# Patient Record
Sex: Male | Born: 1950 | Race: White | Hispanic: Yes | Marital: Married | Smoking: Never smoker
Health system: Southern US, Community
[De-identification: ages and names within clinical notes are randomized; demographics above are authoritative.]

## PROBLEM LIST (undated history)

## (undated) DIAGNOSIS — I10 Essential (primary) hypertension: Secondary | ICD-10-CM

## (undated) HISTORY — PX: CHEST SURGERY: SHX595

## (undated) HISTORY — PX: HERNIA REPAIR: SHX51

---

## 2009-01-20 ENCOUNTER — Emergency Department (HOSPITAL_COMMUNITY): Admission: EM | Admit: 2009-01-20 | Discharge: 2009-01-20 | Payer: Self-pay | Admitting: Emergency Medicine

## 2009-02-15 ENCOUNTER — Encounter: Admission: RE | Admit: 2009-02-15 | Discharge: 2009-02-15 | Payer: Self-pay | Admitting: Surgery

## 2009-02-17 ENCOUNTER — Ambulatory Visit (HOSPITAL_BASED_OUTPATIENT_CLINIC_OR_DEPARTMENT_OTHER): Admission: RE | Admit: 2009-02-17 | Discharge: 2009-02-17 | Payer: Self-pay | Admitting: Surgery

## 2010-09-25 IMAGING — CR DG LUMBAR SPINE COMPLETE 4+V
5 series · 5 of 5 positions shown · non-contrast
Comparison: None

CLINICAL DATA: Low back pain.

LUMBAR SPINE - COMPLETE 4+ VIEW

[t l-spine a.p.]
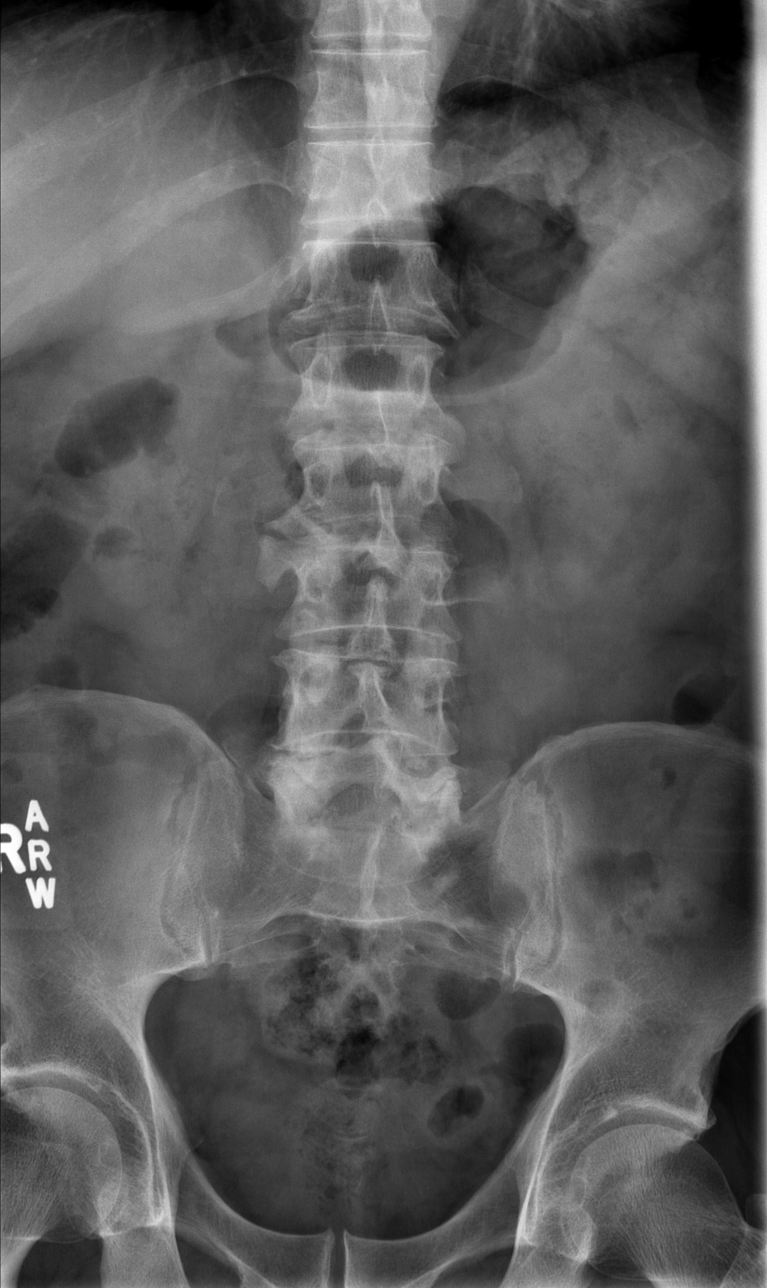

[t l-spine oblique exposure (1 of 2)]
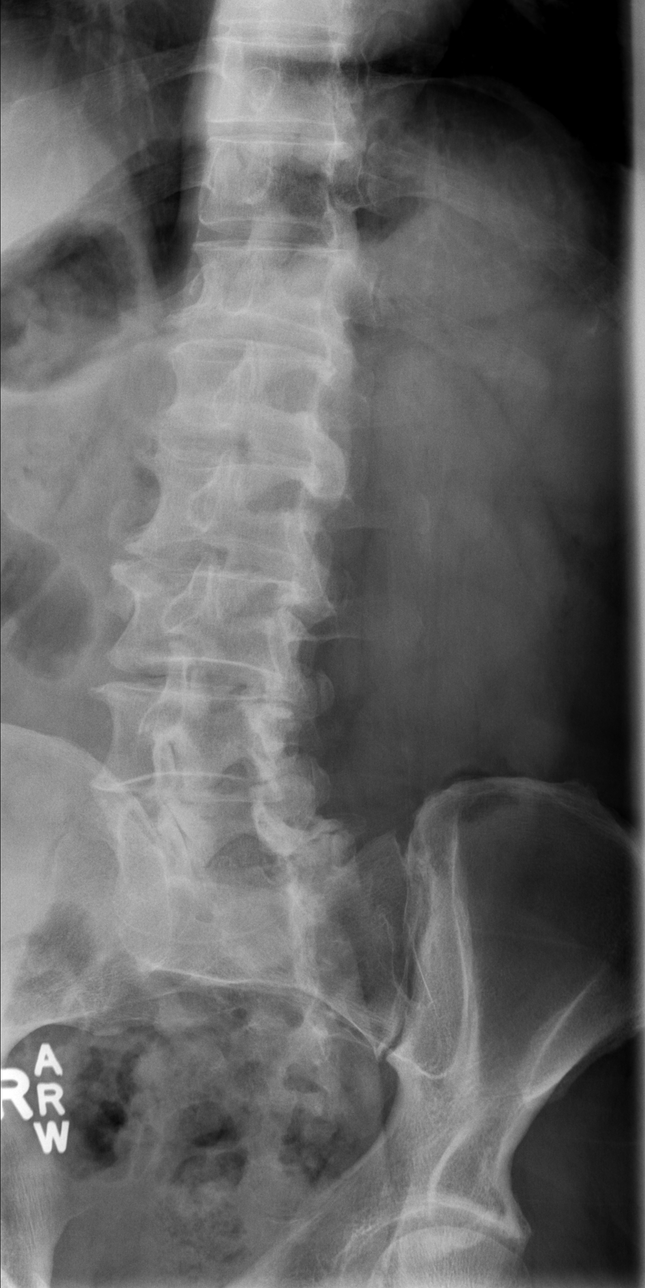

[t l-spine oblique exposure (2 of 2)]
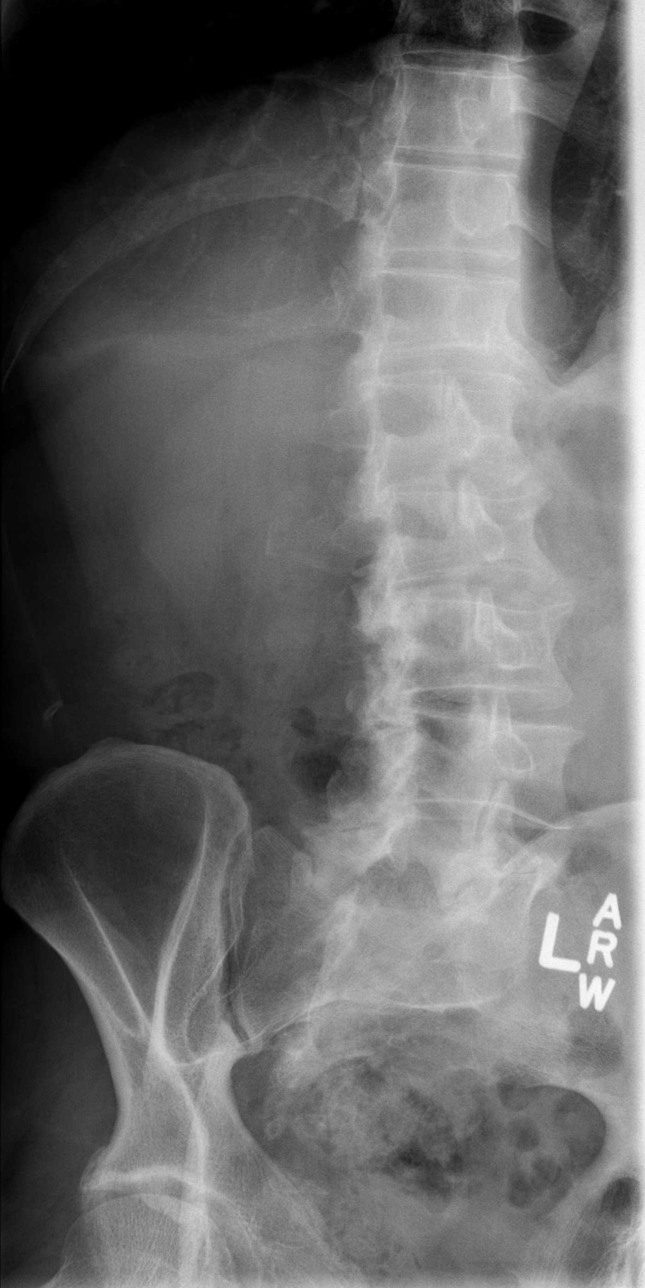

[t l-spine lat]
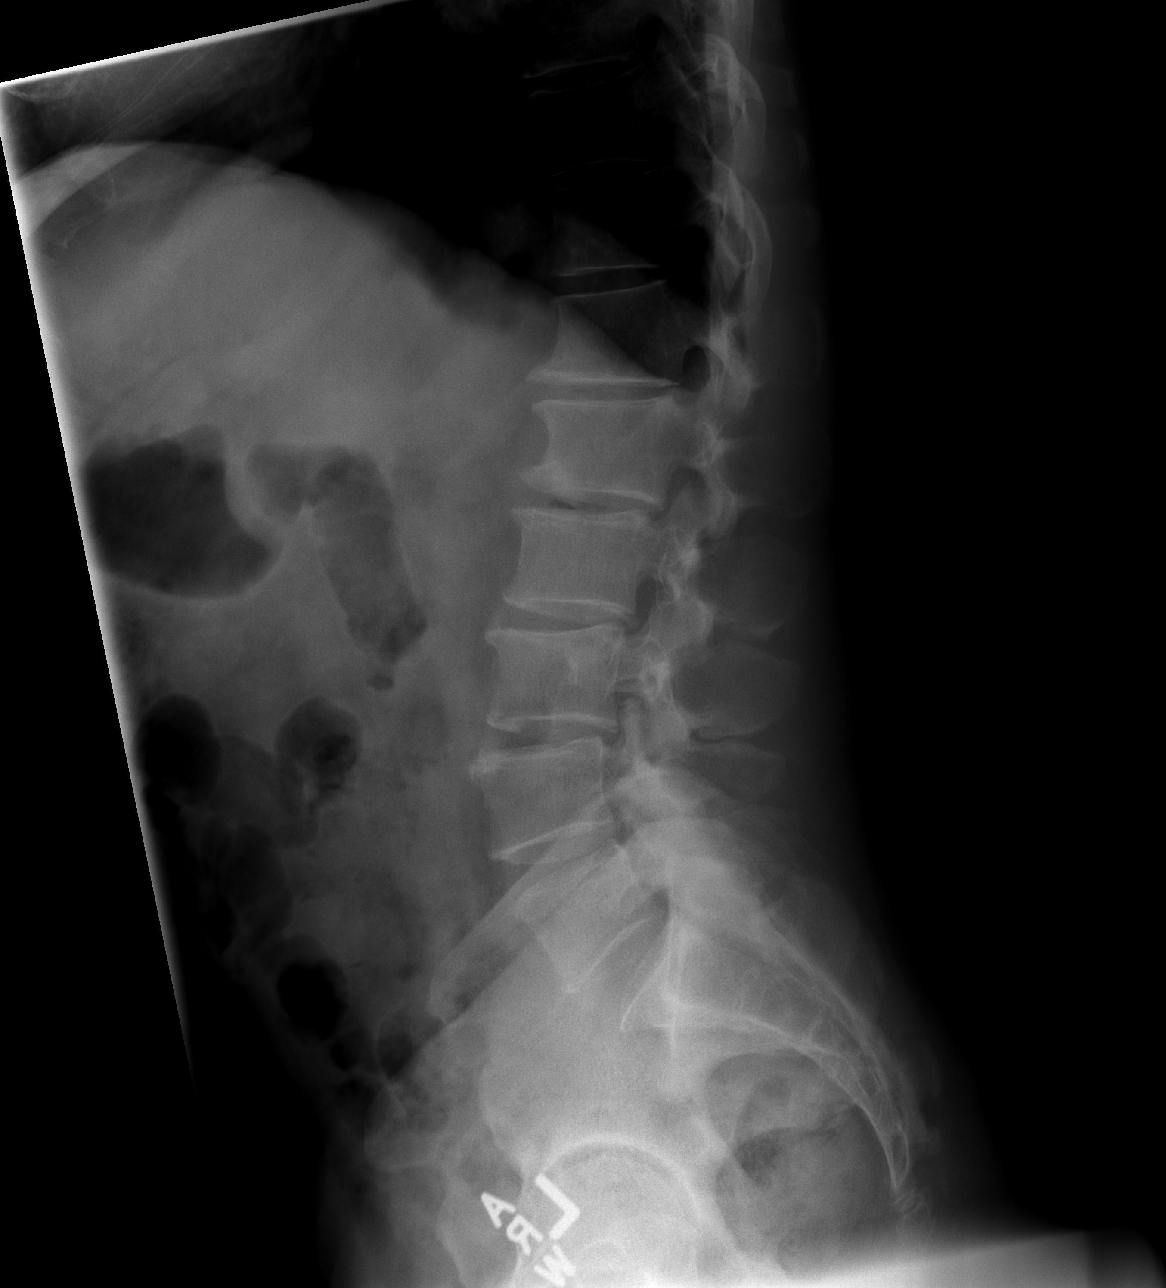

[t l-spine l5-s1 spot]
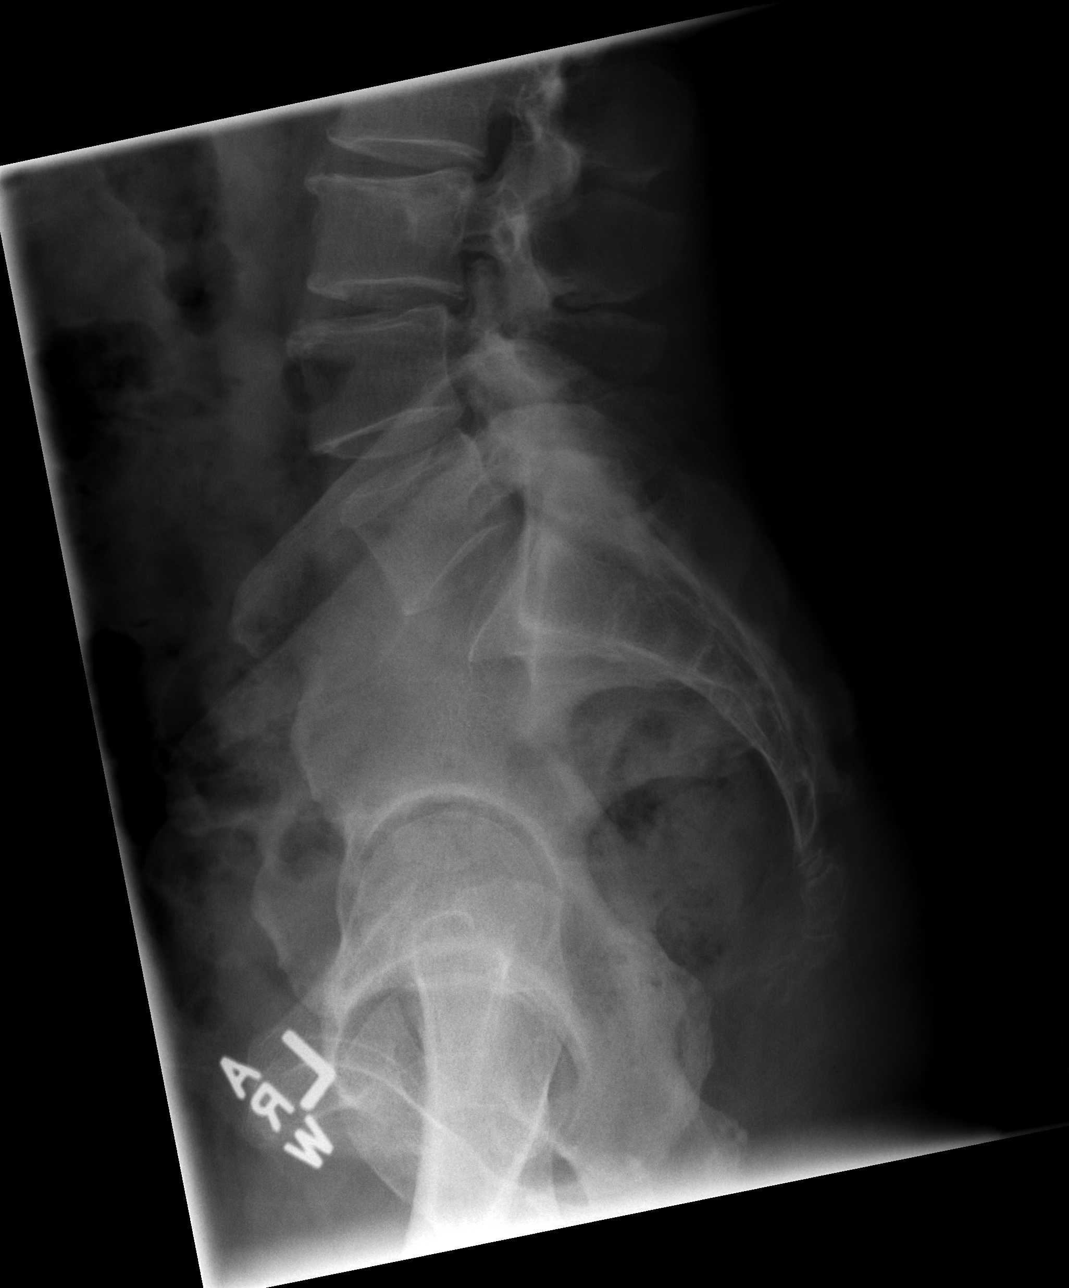

[5 of 5 positions shown; findings below may reference images not displayed]

FINDINGS: The lateral film demonstrates mild degenerative lumbar
spondylosis with mild anterolisthesis of L4 compared L3.  There are
degenerative facet changes in the lower lumbar spine but no
definite pars defects are seen.  Multilevel degenerative disc
disease.  No acute bony findings.  The visualized bony pelvis is
intact.
IMPRESSION: Mild to moderate degenerative changes but no acute bony findings.

## 2010-09-29 ENCOUNTER — Encounter: Admission: RE | Admit: 2010-09-29 | Discharge: 2010-09-29 | Payer: Self-pay | Admitting: General Surgery

## 2011-03-02 LAB — DIFFERENTIAL
Basophils Absolute: 0 10*3/uL (ref 0.0–0.1)
Basophils Relative: 0 % (ref 0–1)
Eosinophils Absolute: 0.1 10*3/uL (ref 0.0–0.7)
Eosinophils Relative: 2 % (ref 0–5)

## 2011-03-02 LAB — BASIC METABOLIC PANEL
BUN: 15 mg/dL (ref 6–23)
CO2: 29 mEq/L (ref 19–32)
Chloride: 105 mEq/L (ref 96–112)
GFR calc non Af Amer: >60 mL/min (ref >60)
Glucose, Bld: 91 mg/dL (ref 70–99)
Potassium: 3.9 mEq/L (ref 3.5–5.1)

## 2011-03-02 LAB — URINALYSIS, ROUTINE W REFLEX MICROSCOPIC
Bilirubin Urine: NEGATIVE
Hgb urine dipstick: NEGATIVE
Ketones, ur: NEGATIVE mg/dL
Nitrite: NEGATIVE
Urobilinogen, UA: 0.2 mg/dL (ref 0.0–1.0)

## 2011-03-02 LAB — CBC
HCT: 42.2 % (ref 39.0–52.0)
MCHC: 34.4 g/dL (ref 30.0–36.0)
MCV: 93.4 fL (ref 78.0–100.0)
Platelets: 202 10*3/uL (ref 150–400)
RDW: 12.4 % (ref 11.5–15.5)

## 2011-03-02 LAB — POCT HEMOGLOBIN-HEMACUE: Hemoglobin: 15.2 g/dL (ref 13.0–17.0)

## 2011-04-04 NOTE — Op Note (Signed)
Barron, [name]   ACCOUNT NO.:  [id number]   MEDICAL RECORD NO.:  [id number]          PATIENT TYPE:  AMB   LOCATION:  DSC                          FACILITY:  [facility]   PHYSICIAN:  Thomas A. Cornett, M.D.DATE OF BIRTH:  11-Apr-1951   DATE OF PROCEDURE:  02/17/2009  DATE OF DISCHARGE:                               OPERATIVE REPORT   PREOPERATIVE DIAGNOSIS:  Right inguinal hernia.   POSTOPERATIVE DIAGNOSIS:  Right inguinal hernia.   PROCEDURE:  Repair of right inguinal hernia with mesh.   SURGEON:  Maisie Fus A. Cornett, MD   ANESTHESIA:  LMA with 0.25% Sensorcaine with epinephrine local.   ESTIMATED BLOOD LOSS:  10 mL   SPECIMENS:  None.   DRAINS:  None.   INDICATIONS FOR PROCEDURE:  The patient is a 60 year old Hispanic male  who presents with a right inguinal hernia.  This is causing discomfort  and he wished to have it repaired.  Discussion was made via a translator  about the procedure, potential complications, and expected outcomes.  He  understood and agreed to proceed.   DESCRIPTION OF PROCEDURE:  The patient was brought to the operating room  and placed supine.  After induction of LMA anesthesia, the right  inguinal region was prepped and draped in a sterile fashion.  He  received preoperative antibiotics.  After infiltration of the right  inguinal crease with 0.25% Sensorcaine local, an oblique incision was  made.  Dissection was carried down through the Scarpa fascia and the  superficial epigastric vessels were ligated with 3-0 Vicryl ties.  The  external oblique aponeurosis was visualized and injected with 0.25%  Sensorcaine.  The fibers were opened in their direction to open the roof  of the inguinal canal.  The cord structures were identified and  encircled with a 0.25-inch Penrose drain.  He had a moderate-sized  direct inguinal hernia.  I was able to open the floor of the inguinal  canal to expose the preperitoneal space.  I put my finger around to  create space.  Examination of the cord revealed no evidence of indirect  hernia.  The iliohypogastric nerves were identified and were medial to  dissection field.  The ilioinguinal nerve was running directly with the  cord and I left it intact.  Some small branches off the ilioinguinal  nerve were divided, so that it would not be tethered to the mesh.  The  femoral nerves were running below the cord structures.  The inner  leaflet of the Prolene hernia system mesh was placed in the  preperitoneal space and spread out.  The onlay was placed on the floor  of the inguinal canal.  A small slit was cut from the cord structures.  I secured the connector piece of mesh to the internal oblique with a 0  Vicryl and a 0 Vicryl was used to connect the onlay to the pubic  tubercle.  A 0 Novofil was used to secure the mesh to the shelving edges  of the inguinal ligament into the conjoined tendon medially.  The new  internal ring had enough space to fit the tip of my fifth digit.  The  mesh laid  quite nicely with no tension.  Irrigation was used and  hemostasis was obtained.  The aponeurosis was closed with a running 2-0  Vicryl.  The 3-0 Vicryl was used to close the Scarpa fascia and 4-0  Monocryl was used to close the skin in a subcuticular fashion.  All  final counts of sponge, needle, and instruments were found to be correct  at this portion of the case.  The patient was awoke and taken to the  recovery in satisfactory condition.      Thomas A. Cornett, M.D.  Electronically Signed     TAC/MEDQ  D:  02/17/2009  T:  02/18/2009  Job:  [id number]

## 2015-05-20 ENCOUNTER — Encounter (HOSPITAL_COMMUNITY): Payer: Self-pay | Admitting: Cardiology

## 2015-05-20 ENCOUNTER — Emergency Department (HOSPITAL_COMMUNITY): Payer: No Typology Code available for payment source

## 2015-05-20 ENCOUNTER — Encounter (HOSPITAL_COMMUNITY): Payer: Self-pay | Admitting: *Deleted

## 2015-05-20 ENCOUNTER — Emergency Department (HOSPITAL_COMMUNITY)
Admission: EM | Admit: 2015-05-20 | Discharge: 2015-05-20 | Disposition: A | Discharge status: Home / Self Care | Payer: No Typology Code available for payment source | Attending: Emergency Medicine | Admitting: Emergency Medicine

## 2015-05-20 ENCOUNTER — Emergency Department (HOSPITAL_COMMUNITY)
Admission: EM | Admit: 2015-05-20 | Discharge: 2015-05-20 | Disposition: A | Discharge status: Other Acute Inpatient Hospital | Payer: No Typology Code available for payment source | Source: Home / Self Care | Attending: Family Medicine | Admitting: Family Medicine

## 2015-05-20 DIAGNOSIS — R1013 Epigastric pain: Secondary | ICD-10-CM | POA: Insufficient documentation

## 2015-05-20 DIAGNOSIS — B079 Viral wart, unspecified: Secondary | ICD-10-CM

## 2015-05-20 DIAGNOSIS — R0789 Other chest pain: Secondary | ICD-10-CM

## 2015-05-20 DIAGNOSIS — I10 Essential (primary) hypertension: Secondary | ICD-10-CM | POA: Insufficient documentation

## 2015-05-20 DIAGNOSIS — R072 Precordial pain: Secondary | ICD-10-CM

## 2015-05-20 DIAGNOSIS — R079 Chest pain, unspecified: Secondary | ICD-10-CM | POA: Insufficient documentation

## 2015-05-20 HISTORY — DX: Essential (primary) hypertension: I10

## 2015-05-20 LAB — BASIC METABOLIC PANEL
Anion gap: 6 (ref 5–15)
BUN: 21 mg/dL — ABNORMAL HIGH (ref 6–20)
CALCIUM: 9 mg/dL (ref 8.9–10.3)
CHLORIDE: 106 mmol/L (ref 101–111)
CO2: 27 mmol/L (ref 22–32)
Creatinine, Ser: 0.83 mg/dL (ref 0.61–1.24)
GFR calc Af Amer: >60 mL/min (ref >60)
GLUCOSE: 88 mg/dL (ref 65–99)
POTASSIUM: 3.9 mmol/L (ref 3.5–5.1)
SODIUM: 139 mmol/L (ref 135–145)

## 2015-05-20 LAB — CBC
HEMATOCRIT: 43.4 % (ref 39.0–52.0)
HEMOGLOBIN: 15.1 g/dL (ref 13.0–17.0)
MCH: 32.5 pg (ref 26.0–34.0)
MCHC: 34.8 g/dL (ref 30.0–36.0)
MCV: 93.5 fL (ref 78.0–100.0)
Platelets: 201 10*3/uL (ref 150–400)
RBC: 4.64 MIL/uL (ref 4.22–5.81)
RDW: 12.6 % (ref 11.5–15.5)
WBC: 8.4 10*3/uL (ref 4.0–10.5)

## 2015-05-20 LAB — I-STAT TROPONIN, ED: TROPONIN I, POC: 0 ng/mL (ref 0.00–0.08)

## 2015-05-20 MED ORDER — IMIQUIMOD 5 % EX CREA: TOPICAL_CREAM | CUTANEOUS | Status: AC

## 2015-05-20 MED ORDER — RANITIDINE HCL 150 MG PO CAPS: 150.0000 mg | ORAL_CAPSULE | Freq: Every day | ORAL | Status: DC

## 2015-05-20 NOTE — ED Provider Notes (Signed)
CSN: [id number]     Arrival date & time 05/20/15  1452 History   First MD Initiated Contact with Patient 05/20/15 1647     Chief Complaint  Patient presents with  . Chest Pain     (Consider location/radiation/quality/duration/timing/severity/associated sxs/prior Treatment) Patient is a 64 y.o. male presenting with chest pain.  Chest Pain Pain location:  Substernal area and epigastric Pain quality: aching   Pain radiates to:  Does not radiate Pain radiates to the back: no   Pain severity:  Mild Onset quality:  Gradual Duration:  3 months Timing:  Sporadic Progression:  Resolved (last felt it 1 month ago) Chronicity:  Recurrent Context: movement   Context: not breathing, no drug use, not eating, no intercourse, not lifting, not raising an arm, not at rest, no stress and no trauma   Relieved by: coke. Worsened by:  Nothing tried Associated symptoms: no abdominal pain, no anorexia, no anxiety, no back pain, no cough, no dizziness, no dysphagia, no fatigue, no fever, no headache, no nausea, no orthopnea, no palpitations, no shortness of breath, not vomiting and no weakness   Risk factors: male sex   Risk factors: no coronary artery disease, no diabetes mellitus, no immobilization, no prior DVT/PE and no smoking  Hypertension: an Opthalmologist told him he "might have HTN"     Past Medical History  Diagnosis Date  . Hypertension    Past Surgical History  Procedure Laterality Date  . Hernia repair     History reviewed. No pertinent family history. History  Substance Use Topics  . Smoking status: Never Smoker   . Smokeless tobacco: Not on file  . Alcohol Use: No    Review of Systems  Constitutional: Negative for fever, chills, appetite change and fatigue.  HENT: Negative for congestion, ear pain, facial swelling, mouth sores, sore throat and trouble swallowing.   Eyes: Negative for visual disturbance.  Respiratory: Negative for cough, chest tightness and shortness of  breath.   Cardiovascular: Positive for chest pain. Negative for palpitations and orthopnea.  Gastrointestinal: Negative for nausea, vomiting, abdominal pain, diarrhea, blood in stool and anorexia.  Endocrine: Negative for cold intolerance and heat intolerance.  Genitourinary: Negative for frequency, decreased urine volume and difficulty urinating.  Musculoskeletal: Negative for back pain and neck stiffness.  Skin: Negative for rash.       Warts   Neurological: Negative for dizziness, weakness, light-headedness and headaches.  All other systems reviewed and are negative.     Allergies  Review of patient's allergies indicates no known allergies.  Home Medications   Prior to Admission medications   Medication Sig Start Date End Date Taking? Authorizing Provider  imiquimod (ALDARA) 5 % cream Apply topically 3 (three) times a week. 05/20/15   Hayden Rasmussenavid Mabe, NP  ranitidine (ZANTAC) 150 MG capsule Take 1 capsule (150 mg total) by mouth daily. 05/20/15   Drema PryPedro Cardama, MD   BP 143/74 mmHg  Pulse 64  Temp(Src) 98.6 F (37 C) (Oral)  Resp 18  SpO2 98% Physical Exam  Constitutional: He is oriented to person, place, and time. He appears well-nourished. No distress.  HENT:  Head: Normocephalic and atraumatic.  Right Ear: External ear normal.  Left Ear: External ear normal.  Eyes: Pupils are equal, round, and reactive to light. Right eye exhibits no discharge. Left eye exhibits no discharge. No scleral icterus.  Neck: Normal range of motion. Neck supple.  Cardiovascular: Normal rate.  Exam reveals no gallop and no friction rub.   No  murmur heard. Pulmonary/Chest: Effort normal and breath sounds normal. No stridor. No respiratory distress. He has no wheezes. He has no rales. He exhibits no tenderness.  Abdominal: Soft. He exhibits no distension and no mass. There is no tenderness. There is no rebound and no guarding.  Musculoskeletal: He exhibits no edema or tenderness.  Neurological: He is  alert and oriented to person, place, and time.  Skin: Skin is warm and dry. No rash noted. He is not diaphoretic. No erythema.  Warts on bilateral hands    ED Course  Procedures (including critical care time) Labs Review Labs Reviewed  BASIC METABOLIC PANEL - Abnormal; Notable for the following:    BUN 21 (*)    All other components within normal limits  CBC  I-STAT TROPOININ, ED    Imaging Review Dg Chest 2 View  05/20/2015   CLINICAL DATA:  Chest pain.  EXAM: CHEST  2 VIEW  COMPARISON:  February 15, 2009.  FINDINGS: The heart size and mediastinal contours are within normal limits. Both lungs are clear. No pneumothorax or pleural effusion is noted. The visualized skeletal structures are unremarkable.  IMPRESSION: No active cardiopulmonary disease.   Electronically Signed   By: Lupita Raider, M.D.   On: 05/20/2015 15:39     EKG Interpretation   Date/Time:  Thursday May 20 2015 15:07:13 EDT Ventricular Rate:  60 PR Interval:  164 QRS Duration: 110 QT Interval:  390 QTC Calculation: 390 R Axis:   -8 Text Interpretation:  Normal sinus rhythm Incomplete right bundle branch  block Borderline ECG Sinus rhythm Non-specific intra-ventricular  conduction delay Artifact T wave abnormality Abnormal ekg Confirmed by  Gerhard Munch  MD [phone]) on 05/20/2015 4:49:37 PM      MDM   64 year old male with a history of hypertension presents to the ED from urgent care clinic for evaluation of chest pain. Patient was being seen at the urgent care for warts on his hands when he informed the provider that he had been having chest discomfort for 3 months. They sent him here for further evaluation. During my interview the patient told them that he had not had chest pains in over a month. The pains were described as discomfort in the epigastrium lasting 2-3 minutes and relieved by [employer]. No associated radiation, shortness of breath, diaphoresis, or nausea with the chest discomfort. No recent  fevers, illnesses, or infections. Patient is currently asymptomatic. EKG with incomplete right bundle-branch block, nonspecific T-wave changes. No evidence of acute ischemia, arrhythmia. Troponin negative. CBC without leukocytosis or anemia. BMP without electrolyte derangements or renal insufficiency. Chest x-ray without evidence of pneumonia, pneumothorax, pneumomediastinum, widening or abnormal contour of the mediastinum, pulmonary edema, pleural effusions. Epigastric discomfort likely dyspepsia. There is low clinical suspicion for ACS, pulmonary embolism, dissection at this time. No evidence of pericarditis on EKG.  Patient in good condition is stable for discharge with strict return precautions. Patient follow-up with his primary care provider for continued management and workup.  She seen in conjunction with Dr. Jeraldine Loots.  Final diagnoses:  Warts  Chest discomfort  Epigastric discomfort        Drema Pry, MD 05/20/15 1755  Gerhard Munch, MD 05/21/15 [id number]

## 2015-05-20 NOTE — ED Notes (Signed)
[facility] but then told them that he has had intermittent episodes of chest pain recently. No n/v or SOB.

## 2015-05-20 NOTE — ED Provider Notes (Signed)
CSN: [id number]     Arrival date & time 05/20/15  1300 History   First MD Initiated Contact with Patient 05/20/15 1319     Chief Complaint  Patient presents with  . 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 There are several lesions to both hands and fingers. Minor tenderness. They are clear to flesh-colored,  thick. No erythema or signs of infection.  Second complaint as I was walking out the door is  that of chest pain. He is complaining of precordial pain that is described as tight or heavy and is intermittent. It is been occurring for the past 2 months. It occurs at rest and exertion. He states nothing in particular makes it worse. But drinking a "soda" often relieves the discomfort. He denies heartburn or reflux like symptoms.No belching or gas.   Past Medical History  Diagnosis Date  . Hypertension    Past Surgical History  Procedure Laterality Date  . Hernia repair     History reviewed. No pertinent family history. History  Substance Use Topics  . Smoking status: Never Smoker   . Smokeless tobacco: Not on file  . Alcohol Use: No    Review of Systems  Constitutional: Negative for fever, activity change and appetite change.  HENT: Negative.   Respiratory: Negative for cough and shortness of breath.   Cardiovascular: Positive for chest pain. Negative for leg swelling.       Patient points to the area over the midsternum and high epigastrium as the source of chest discomfort.  Gastrointestinal: Negative for nausea, vomiting and constipation.  Musculoskeletal: Negative.   Skin: Negative.   Neurological: Negative.   Psychiatric/Behavioral: Negative.     Allergies  Review of patient's allergies indicates no known allergies.  Home Medications   Prior to Admission  medications   Medication Sig Start Date End Date Taking? Authorizing Provider  imiquimod (ALDARA) 5 % cream Apply topically 3 (three) times a week. 05/20/15   Hayden Rasmussenavid Marciano Mundt, NP   BP 142/86 mmHg  Pulse 72  Temp(Src) 98.6 F (37 C) (Oral)  Resp 18  SpO2 100% Physical Exam  Constitutional: He is oriented to person, place, and time. He appears well-developed and well-nourished. No distress.  Eyes: EOM are normal.  Neck: Normal range of motion. Neck supple.  Cardiovascular: Normal rate, regular rhythm, normal heart sounds and intact distal pulses.   Pulmonary/Chest: Effort normal and breath sounds normal. No respiratory distress. He has no wheezes.  Abdominal: Soft. There is no tenderness. There is no rebound.  Musculoskeletal: He exhibits no edema.  Lymphadenopathy:    He has no cervical adenopathy.  Neurological: He is alert and oriented to person, place, and time. He exhibits normal muscle tone.  Skin: Skin is warm and dry.  1-2 mm isolated lesions as well as cropping of lesions that are flesh-colored to slightly whitish, thick located to the palmar aspect of both hands including the digits.  Psychiatric: He has a normal mood and affect.  Nursing note and vitals reviewed.   ED Course  Procedures (including critical care time) Labs Review Labs Reviewed - No data to display ED ECG REPORT   Date: 05/20/2015  Rate: 66  Rhythm: normal sinus rhythm  QRS Axis: normal  Intervals: normal  ST/T Wave abnormalities: ST depressions inferiorly  Lead III  Conduction Disutrbances:right bundle branch block  Narrative Interpretation:  Old EKG Reviewed: none available  I have personally reviewed the EKG tracing and agree with the computerized printout as noted.  Imaging Review No results found.   MDM   1. Viral warts   2. Precordial pain   3. Other chest pain    Transfer to the ED for evaluation of atypical chest pain. No PCP    Hayden Rasmussen, NP 05/20/15 1427  Hayden Rasmussen,  NP 05/20/15 1429

## 2015-05-20 NOTE — ED Notes (Addendum)
Pt  Reports   Symptoms  Of      painfull  Bumps  On  Both  Hands         X  sev   Weeks  The   Bumps  Are  Not  Anywhere  Karolee Ohslse    Also  Reports  intermittant  Tightness    Sensation  In  Epigastric  Area   That   He  Has  Had  For  Months

## 2015-05-20 NOTE — Discharge Instructions (Signed)
Verrugas  (Warts)  Las verrugas son frecuentes. Pueden ser originadas por un virus. Es ms comn en nios mayores. Puede aparecer Neomia Dear verruga nica o varias. El tamao y la ubicacin varan. Pueden diseminarse al rascarlas y luego rascar la piel normal. Sin embargo, la mayora desaparecen luego de algunos meses o [place]. CUIDADOS EN EL HOGAR   Siga los procedimientos que le ha indicado el profesional que lo asiste.  Cumpla con los controles mdicos segn las indicaciones. Las Primary school teacher a Research officer, trade union SOLICITE AYUDA DE INMEDIATO SI:  La piel de la zona tratada se vuelve roja, se hinchase inflama) o le duele.  ASEGRESE DE QUE:   Comprende estas instrucciones.  Controlar su enfermedad.  Solicitar ayuda de inmediato si no mejora o si empeora. Document Released: 04/10/2011 Document Revised: 01/29/2012 [facility] Patient Information 2015 [place], [place]. This information is not intended to replace advice given to you by your health care provider. Make sure you discuss any questions you have with your health care provider.  Dolor de pecho (no especfico) (Chest Pain (Nonspecific)) Con frecuencia es difcil dar un diagnstico especfico de la causa del dolor de [place]. Siempre hay una posibilidad de que el dolor podra estar relacionado con algo grave, como un ataque al corazn o un cogulo sanguneo en los pulmones. Debe someterse a controles con el mdico para ms evaluaciones. CAUSAS   Acidez.  Neumona o bronquitis.  Ansiedad o estrs.  Inflamacin de la zona que rodea al corazn (pericarditis) o a los pulmones (pleuritis o pleuresa).  Un cogulo sanguneo en el pulmn.  Colapso de un pulmn (neumotrax), que puede aparecer de [employer] repentina por s solo (neumotrax espontneo) o debido a un traumatismo en el trax.  Culebrilla (virus del herpes zster). La pared torcica est compuesta por huesos, msculos y TEFL teacher. Cualquiera de estos puede ser la fuente del  dolor.  Puede haber una contusin en los huesos debido a una lesin.  Puede haber un esguince en los msculos o el cartlago ocasionado por la tos o por [place].  El cartlago puede verse afectado por una inflamacin y Psychologist, counselling (costocondritis). DIAGNSTICO  Gretchen Short se necesiten anlisis de laboratorio u otros estudios para Veterinary surgeon causa del Engineer, mining. Adems, puede indicarle que se haga una prueba llamada electrocadiograma (ECG) ambulatorio. El ECG registra los patrones de los latidos cardacos durante 24horas. Adems, pueden hacerle otros estudios, por ejemplo:  Ecocardiograma transtorcico (ETT). Durante Management consultant, se usan ondas sonoras para evaluar el flujo de la sangre a travs del corazn.  Ecocardiograma transesofgico (ETE).  Monitoreo cardaco. Permite que el mdico controle la frecuencia y el ritmo cardaco en tiempo real.  Monitor Holter. Es un dispositivo porttil que [employer] latidos cardacos y [place] a Education administrator las arritmias cardacas. Le permite al [employer] registrar la actividad cardaca durante varios das, si es necesario.  Pruebas de estrs por ejercicio o por medicamentos que aceleran los latidos cardacos. TRATAMIENTO   El tratamiento depende de la causa del dolor de [place]. El tratamiento puede incluir:  Inhibidores de la acidez estomacal.  Antiinflamatorios.  Analgsicos para las enfermedades inflamatorias.  Antibiticos, si hay una infeccin.  Podrn aconsejarle que modifique su estilo de vida. Esto incluye dejar de fumar y evitar el alcohol, la cafena y el chocolate.  Pueden aconsejarle que mantenga la cabeza levantada (elevada) cuando duerme. Esto reduce la probabilidad de que el cido retroceda del estmago al esfago. En la [employer], el dolor de pecho no especfico mejorar en el  trmino de 2 a 3das, con reposo y IAC/InterActiveCorpanalgsicos suaves.  INSTRUCCIONES PARA EL CUIDADO EN EL HOGAR   Si le prescriben antibiticos,  tmelos tal como se le indic. Termnelos aunque comience a sentirse mejor.  [address]Durante los das siguientes, no haga actividades fsicas que provoquen dolor de [place]. Contine con las actividades fsicas tal como se le indic  No consuma ningn producto que contenga tabaco, incluidos cigarrillos, tabaco de Theatre managermascar o cigarrillos electrnicos.  Evite el consumo de alcohol.  Tome los medicamentos solamente como se lo haya indicado el mdico.  Siga las sugerencias del mdico en lo que respecta a las pruebas adicionales, si el dolor de pecho no desaparece.  Concurra a todas las visitas de control programadas. Si no lo hace, podra desarrollar problemas permanentes (crnicos) relacionados con el dolor. Si hay algn problema para concurrir a una cita, llame para reprogramarla. SOLICITE ATENCIN MDICA SI:   El dolor de pecho no desaparece, incluso despus del tratamiento.  Tiene una erupcin cutnea con ampollas en el pecho.  Tiene fiebre. SOLICITE ATENCIN MDICA DE Engelhard CorporationNMEDIATO SI:   Aumenta el dolor de pecho o este se irradia hacia el brazo, el cuello, la [place], la espalda o el abdomen.  Le falta el aire.  La tos empeora, o expectora sangre.  Siente dolor intenso en la espalda o el abdomen.  Se siente nauseoso o vomita.  Siente debilidad intensa.  Se desmaya.  Tiene escalofros. Esto es Radio broadcast assistantuna emergencia. No espere a ver si el dolor se pasa. Obtenga ayuda mdica de inmediato. Llame a los servicios de emergencia locales (911 en los [place]). No conduzca por sus propios medios Dollar Generalhasta el hospital. ASEGRESE DE QUE:   Comprende estas instrucciones.  Controlar su afeccin.  Recibir ayuda de inmediato si no mejora o si empeora. Document Released: 11/06/2005 Document Revised: 11/11/2013 [facility] Patient Information 2015 [place]. This information is not intended to replace advice given to you by your health care provider. Make sure you discuss any questions you have with  your health care provider.

## 2016-04-26 ENCOUNTER — Encounter (HOSPITAL_COMMUNITY): Admission: RE | Payer: Self-pay | Source: Ambulatory Visit

## 2016-04-26 ENCOUNTER — Ambulatory Visit (HOSPITAL_COMMUNITY)
Admission: RE | Admit: 2016-04-26 | Payer: No Typology Code available for payment source | Source: Ambulatory Visit | Admitting: Gastroenterology

## 2016-04-26 SURGERY — COLONOSCOPY WITH PROPOFOL
Anesthesia: Monitor Anesthesia Care

## 2017-03-21 ENCOUNTER — Other Ambulatory Visit: Payer: Self-pay | Admitting: Gastroenterology

## 2017-03-23 ENCOUNTER — Encounter (HOSPITAL_COMMUNITY): Payer: Self-pay | Admitting: *Deleted

## 2017-03-23 NOTE — Progress Notes (Signed)
Pt SDW-Pre-op call completed using Spanish Sharyne Richtersnterpreter, Adrian # [phone]. Pt denies SOB, chest pain, and being under the care of a cardiologist. Pt denies having a stress test, echo and cardiac cath. Pt denies having an EKG and chest x ray within the last year. Pt denies having recent labs. Pt made aware to stop taking  Aspirin, vitamins, fish oil and herbal medications. Do not take any NSAIDs ie: Ibuprofen, Advil, Naproxen, BC and Goody Powder or any medication containing Aspirin. Pt verbalized understanding of all pre-op instructions. Pt requested that I call friend/personal interpreter Delia V. And provide her with the telephone number to Endoscopy. As requested, Delia made aware.

## 2017-03-26 ENCOUNTER — Encounter (HOSPITAL_COMMUNITY)
Admission: RE | Disposition: A | Discharge status: Home / Self Care | Payer: Self-pay | Source: Ambulatory Visit | Attending: Gastroenterology

## 2017-03-26 ENCOUNTER — Encounter (HOSPITAL_COMMUNITY): Payer: Self-pay | Admitting: Anesthesiology

## 2017-03-26 ENCOUNTER — Ambulatory Visit (HOSPITAL_COMMUNITY)
Admission: RE | Admit: 2017-03-26 | Discharge: 2017-03-26 | Disposition: A | Discharge status: Home / Self Care | Payer: Self-pay | Source: Ambulatory Visit | Attending: Gastroenterology | Admitting: Gastroenterology

## 2017-03-26 ENCOUNTER — Ambulatory Visit (HOSPITAL_COMMUNITY): Payer: Self-pay | Admitting: Anesthesiology

## 2017-03-26 DIAGNOSIS — K2950 Unspecified chronic gastritis without bleeding: Secondary | ICD-10-CM | POA: Insufficient documentation

## 2017-03-26 DIAGNOSIS — R197 Diarrhea, unspecified: Secondary | ICD-10-CM | POA: Diagnosis not present

## 2017-03-26 DIAGNOSIS — K259 Gastric ulcer, unspecified as acute or chronic, without hemorrhage or perforation: Secondary | ICD-10-CM | POA: Insufficient documentation

## 2017-03-26 DIAGNOSIS — R195 Other fecal abnormalities: Secondary | ICD-10-CM

## 2017-03-26 DIAGNOSIS — K640 First degree hemorrhoids: Secondary | ICD-10-CM | POA: Insufficient documentation

## 2017-03-26 DIAGNOSIS — K269 Duodenal ulcer, unspecified as acute or chronic, without hemorrhage or perforation: Secondary | ICD-10-CM | POA: Insufficient documentation

## 2017-03-26 DIAGNOSIS — K5730 Diverticulosis of large intestine without perforation or abscess without bleeding: Secondary | ICD-10-CM | POA: Insufficient documentation

## 2017-03-26 DIAGNOSIS — K219 Gastro-esophageal reflux disease without esophagitis: Secondary | ICD-10-CM | POA: Insufficient documentation

## 2017-03-26 DIAGNOSIS — K2980 Duodenitis without bleeding: Secondary | ICD-10-CM | POA: Insufficient documentation

## 2017-03-26 DIAGNOSIS — Z79899 Other long term (current) drug therapy: Secondary | ICD-10-CM | POA: Insufficient documentation

## 2017-03-26 DIAGNOSIS — K3189 Other diseases of stomach and duodenum: Secondary | ICD-10-CM | POA: Insufficient documentation

## 2017-03-26 HISTORY — PX: ESOPHAGOGASTRODUODENOSCOPY (EGD) WITH PROPOFOL: SHX5813

## 2017-03-26 HISTORY — PX: COLONOSCOPY WITH PROPOFOL: SHX5780

## 2017-03-26 SURGERY — ESOPHAGOGASTRODUODENOSCOPY (EGD) WITH PROPOFOL
Anesthesia: Monitor Anesthesia Care

## 2017-03-26 MED ORDER — PROPOFOL 10 MG/ML IV BOLUS
INTRAVENOUS | Status: DC | PRN
  Administered 2017-03-26 (×2): 20 mg via INTRAVENOUS

## 2017-03-26 MED ORDER — LACTATED RINGERS IV SOLN
INTRAVENOUS | Status: DC
  Administered 2017-03-26: 09:00:00 via INTRAVENOUS

## 2017-03-26 MED ORDER — SODIUM CHLORIDE 0.9 % IV SOLN: INTRAVENOUS | Status: DC

## 2017-03-26 MED ORDER — BUTAMBEN-TETRACAINE-BENZOCAINE 2-2-14 % EX AERO
INHALATION_SPRAY | CUTANEOUS | Status: DC | PRN
  Administered 2017-03-26: 2 via TOPICAL

## 2017-03-26 MED ORDER — PROPOFOL 500 MG/50ML IV EMUL
INTRAVENOUS | Status: DC | PRN
  Administered 2017-03-26: 75 ug/kg/min via INTRAVENOUS

## 2017-03-26 SURGICAL SUPPLY — 24 items

## 2017-03-26 NOTE — Anesthesia Procedure Notes (Signed)
Procedure Name: MAC Date/Time: 03/26/2017 10:35 AM Performed by: Jenne Campus Pre-anesthesia Checklist: Patient identified, Emergency Drugs available, Suction available, Patient being monitored and Timeout performed Oxygen Delivery Method: Simple face mask

## 2017-03-26 NOTE — Op Note (Signed)
[facility] Patient Name: [name] Procedure Date : 03/26/2017 MRN: [id number] Attending MD: Lear Ng , MD Date of Birth: 16-Jan-1951 CSN: [id number] Age: 66 Admit Type: Outpatient Procedure:                Upper GI endoscopy Indications:              Heme positive stool Providers:                Lear Ng, MD, Zenon Mayo, RN, Corliss Parish, Technician Referring MD:              Medicines:                Propofol per Anesthesia, Monitored Anesthesia Care Complications:            No immediate complications. Estimated Blood Loss:     Estimated blood loss was minimal. Procedure:                Pre-Anesthesia Assessment:                           - Prior to the procedure, a History and Physical                            was performed, and patient medications and                            allergies were reviewed. The patient's tolerance of                            previous anesthesia was also reviewed. The risks                            and benefits of the procedure and the sedation                            options and risks were discussed with the patient.                            All questions were answered, and informed consent                            was obtained. Prior Anticoagulants: The patient has                            taken no previous anticoagulant or antiplatelet                            agents. ASA Grade Assessment: II - A patient with                            mild systemic disease. After reviewing the risks  and benefits, the patient was deemed in                            satisfactory condition to undergo the procedure.                           After obtaining informed consent, the endoscope was                            passed under direct vision. Throughout the                            procedure, the patient's blood pressure, pulse, and                         oxygen saturations were monitored continuously. The                            Endoscope was introduced through the mouth, and                            advanced to the second part of duodenum. The upper                            GI endoscopy was accomplished without difficulty.                            The patient tolerated the procedure well. Scope In: Scope Out: Findings:      The examined esophagus was normal.      The Z-line was regular and was found 40 cm from the incisors.      One non-bleeding superficial gastric ulcer with pigmented material was       found in the prepyloric region of the stomach. The lesion was 2 mm in       largest dimension.      Localized moderate inflammation characterized by congestion (edema),       erosions and erythema was found in the prepyloric region of the stomach.       Biopsies were taken with a cold forceps for histology. Estimated blood       loss was minimal.      The cardia and gastric fundus were normal on retroflexion.      Many non-bleeding superficial duodenal ulcers with pigmented material       were found in the duodenal bulb. The largest lesion was 5 mm in largest       dimension.      Patchy mild mucosal changes characterized by erythema and erosion were       found in the duodenal bulb. Biopsies were taken with a cold forceps for       histology. Estimated blood loss was minimal.      The second portion of the duodenum was normal. Impression:               - Normal esophagus.                           - Z-line regular, 40 cm from the  incisors.                           - Non-bleeding gastric ulcer with pigmented                            material.                           - Acute gastritis. Biopsied.                           - Multiple non-bleeding duodenal ulcers with                            pigmented material.                           - Mucosal changes in the duodenum. Biopsied.                            - Normal second portion of the duodenum. Moderate Sedation:      N/A - MAC procedure Recommendation:           - Await pathology results.                           - Resume previous diet.                           - No aspirin, ibuprofen, naproxen, or other                            non-steroidal anti-inflammatory drugs. Procedure Code(s):        --- Professional ---                           [phone], Esophagogastroduodenoscopy, flexible,                            transoral; with biopsy, single or multiple Diagnosis Code(s):        --- Professional ---                           R19.5, Other fecal abnormalities                           K26.9, Duodenal ulcer, unspecified as acute or                            chronic, without hemorrhage or perforation                           K25.9, Gastric ulcer, unspecified as acute or                            chronic, without hemorrhage or perforation  K29.00, Acute gastritis without bleeding                           K31.89, Other diseases of stomach and duodenum CPT copyright 2016 [employer]. All rights reserved. The codes documented in this report are preliminary and upon coder review may  be revised to meet current compliance requirements. Lear Ng, MD 03/26/2017 11:17:51 AM This report has been signed electronically. Number of Addenda: 0

## 2017-03-26 NOTE — H&P (Signed)
Date of Initial H&P: 03/20/17  History reviewed, patient examined, no change in status, stable for surgery.  

## 2017-03-26 NOTE — Discharge Instructions (Addendum)
YOU HAD AN ENDOSCOPIC PROCEDURE TODAY: Refer to the procedure report and other information in the discharge instructions given to you for any specific questions about what was found during the examination. If this information does not answer your questions, please call [facility] office at [phone] to clarify.  ° °YOU SHOULD EXPECT: Some feelings of bloating in the abdomen. Passage of more gas than usual. Walking can help get rid of the air that was put into your GI tract during the procedure and reduce the bloating. If you had a lower endoscopy (such as a colonoscopy or flexible sigmoidoscopy) you may notice spotting of blood in your stool or on the toilet paper. Some abdominal soreness may be present for a day or two, also. ° °DIET: Your first meal following the procedure should be a light meal and then it is ok to progress to your normal diet. A half-sandwich or bowl of soup is an example of a good first meal. Heavy or fried foods are harder to digest and may make you feel nauseous or bloated. Drink plenty of fluids but you should avoid alcoholic beverages for 24 hours. If you had a esophageal dilation, please see attached instructions for diet.  ° °ACTIVITY: Your care partner should take you home directly after the procedure. You should plan to take it easy, moving slowly for the rest of the day. You can resume normal activity the day after the procedure however YOU SHOULD NOT DRIVE, use power tools, machinery or perform tasks that involve climbing or major physical exertion for 24 hours (because of the sedation medicines used during the test).  ° °SYMPTOMS TO REPORT IMMEDIATELY: °A gastroenterologist can be reached at any hour. Please call [phone]  for any of the following symptoms:  °Following lower endoscopy (colonoscopy, flexible sigmoidoscopy) °Excessive amounts of blood in the stool  °Significant tenderness, worsening of abdominal pains  °Swelling of the abdomen that is new, acute  °Fever of 100° or  higher  °Following upper endoscopy (EGD, EUS, ERCP, esophageal dilation) °Vomiting of blood or coffee ground material  °New, significant abdominal pain  °New, significant chest pain or pain under the shoulder blades  °Painful or persistently difficult swallowing  °New shortness of breath  °Black, tarry-looking or red, bloody stools ° °FOLLOW UP:  °If any biopsies were taken you will be contacted by phone or by letter within the next 1-3 weeks. Call [phone]  if you have not heard about the biopsies in 3 weeks.  °Please also call with any specific questions about appointments or follow up tests. °Repeat colonoscopy in 10 years. °

## 2017-03-26 NOTE — Anesthesia Preprocedure Evaluation (Addendum)
Anesthesia Evaluation  Patient identified by MRN, date of birth, ID band Patient awake    Reviewed: Allergy & Precautions, NPO status , Patient's Chart, lab work & pertinent test results  Airway Mallampati: II  TM Distance: >3 FB Neck ROM: Full    Dental  (+) Teeth Intact, Dental Advisory Given   Pulmonary neg pulmonary ROS,    breath sounds clear to auscultation       Cardiovascular hypertension, Pt. on medications  Rhythm:Regular Rate:Normal     Neuro/Psych negative neurological ROS  negative psych ROS   GI/Hepatic Neg liver ROS, GERD  Medicated,  Endo/Other  negative endocrine ROS  Renal/GU negative Renal ROS  negative genitourinary   Musculoskeletal negative musculoskeletal ROS (+)   Abdominal   Peds negative pediatric ROS (+)  Hematology negative hematology ROS (+)   Anesthesia Other Findings   Reproductive/Obstetrics negative OB ROS                            Anesthesia Physical Anesthesia Plan  ASA: II  Anesthesia Plan: MAC   Post-op Pain Management:    Induction: Intravenous  Airway Management Planned: Natural Airway  Additional Equipment:   Intra-op Plan:   Post-operative Plan:   Informed Consent: I have reviewed the patients History and Physical, chart, labs and discussed the procedure including the risks, benefits and alternatives for the proposed anesthesia with the patient or authorized representative who has indicated his/her understanding and acceptance.     Plan Discussed with: CRNA  Anesthesia Plan Comments:         Anesthesia Quick Evaluation

## 2017-03-26 NOTE — Transfer of Care (Signed)
Immediate Anesthesia Transfer of Care Note  Patient: [name]  Procedure(s) Performed: Procedure(s): ESOPHAGOGASTRODUODENOSCOPY (EGD) WITH PROPOFOL (N/A) COLONOSCOPY WITH PROPOFOL (N/A)  Patient Location: Endoscopy Unit  Anesthesia Type:MAC  Level of Consciousness: oriented and patient cooperative  Airway & Oxygen Therapy: Patient Spontanous Breathing and Patient connected to nasal cannula oxygen  Post-op Assessment: Report given to RN and Post -op Vital signs reviewed and stable  Post vital signs: Reviewed  Last Vitals:  Vitals:   03/26/17 0820 03/26/17 1125  BP: 140/80 126/68  Pulse: 87 80  Resp: 18 (!) 21  Temp: 36.6 C 36.5 C    Last Pain:  Vitals:   03/26/17 1125  TempSrc: Oral         Complications: No apparent anesthesia complications

## 2017-03-26 NOTE — Interval H&P Note (Signed)
History and Physical Interval Note:  03/26/2017 10:35 AM  [name]  has presented today for surgery, with the diagnosis of hem. positive stool /diarrhea/hpylori  The various methods of treatment have been discussed with the patient and family. After consideration of risks, benefits and other options for treatment, the patient has consented to  Procedure(s): ESOPHAGOGASTRODUODENOSCOPY (EGD) WITH PROPOFOL (N/A) COLONOSCOPY WITH PROPOFOL (N/A) as a surgical intervention .  The patient's history has been reviewed, patient examined, no change in status, stable for surgery.  I have reviewed the patient's chart and labs.  Questions were answered to the patient's satisfaction.     Kensley Lares C.

## 2017-03-26 NOTE — Op Note (Addendum)
[facility] Patient Name: [name] Procedure Date : 03/26/2017 MRN: [id number] Attending MD: Lear Ng , MD Date of Birth: 31-Jan-1951 CSN: [id number] Age: 66 Admit Type: Outpatient Procedure:                Colonoscopy Indications:              This is the patient's first colonoscopy, Heme                            positive stool Providers:                Lear Ng, MD, Zenon Mayo, RN, Corliss Parish, Technician Referring MD:              Medicines:                Propofol per Anesthesia, Monitored Anesthesia Care Complications:            No immediate complications. Estimated Blood Loss:     Estimated blood loss: none. Procedure:                Pre-Anesthesia Assessment:                           - Prior to the procedure, a History and Physical                            was performed, and patient medications and                            allergies were reviewed. The patient's tolerance of                            previous anesthesia was also reviewed. The risks                            and benefits of the procedure and the sedation                            options and risks were discussed with the patient.                            All questions were answered, and informed consent                            was obtained. Prior Anticoagulants: The patient has                            taken no previous anticoagulant or antiplatelet                            agents. ASA Grade Assessment: II - A patient with  mild systemic disease. After reviewing the risks                            and benefits, the patient was deemed in                            satisfactory condition to undergo the procedure.                           - Prior to the procedure, a History and Physical                            was performed, and patient medications and   allergies were reviewed. The patient's tolerance of                            previous anesthesia was also reviewed. The risks                            and benefits of the procedure and the sedation                            options and risks were discussed with the patient.                            All questions were answered, and informed consent                            was obtained. Prior Anticoagulants: The patient has                            taken no previous anticoagulant or antiplatelet                            agents. ASA Grade Assessment: II - A patient with                            mild systemic disease. After reviewing the risks                            and benefits, the patient was deemed in                            satisfactory condition to undergo the procedure.                           After obtaining informed consent, the colonoscope                            was passed under direct vision. Throughout the                            procedure, the patient's blood pressure, pulse, and  oxygen saturations were monitored continuously. The                            EC-[id number] ([id number]) scope was introduced through                            the anus and advanced to the the cecum, identified                            by appendiceal orifice and ileocecal valve. The                            colonoscopy was performed without difficulty. The                            patient tolerated the procedure well. The quality                            of the bowel preparation was adequate and good. The                            terminal ileum, the appendiceal orifice and the                            rectum were photographed. Scope In: 10:58:17 AM Scope Out: 11:08:13 AM Scope Withdrawal Time: 0 hours 4 minutes 42 seconds  Total Procedure Duration: 0 hours 9 minutes 56 seconds  Findings:      The perianal and digital rectal examinations  were normal.      Scattered small-mouthed diverticula were found in the sigmoid colon.      Internal hemorrhoids were found during retroflexion. The hemorrhoids       were small and Grade I (internal hemorrhoids that do not prolapse).      The terminal ileum appeared normal. Impression:               - Diverticulosis in the sigmoid colon.                           - Internal hemorrhoids.                           - The examined portion of the ileum was normal.                           - No specimens collected. Moderate Sedation:      N/A - MAC procedure Recommendation:           - Patient has a contact number available for                            emergencies. The signs and symptoms of potential                            delayed complications were discussed with the  patient. Return to normal activities tomorrow.                            Written discharge instructions were provided to the                            patient.                           - High fiber diet.                           - Repeat colonoscopy in 10 years for screening                            purposes.                           - Continue present medications. Procedure Code(s):        --- Professional ---                           [phone], Colonoscopy, flexible; diagnostic, including                            collection of specimen(s) by brushing or washing,                            when performed (separate procedure) Diagnosis Code(s):        --- Professional ---                           R19.5, Other fecal abnormalities                           K64.0, First degree hemorrhoids                           K57.30, Diverticulosis of large intestine without                            perforation or abscess without bleeding CPT copyright 2016 [employer]. All rights reserved. The codes documented in this report are preliminary and upon coder review may  be revised to  meet current compliance requirements. Lear Ng, MD 03/26/2017 11:26:21 AM This report has been signed electronically. Number of Addenda: 0

## 2017-03-26 NOTE — Anesthesia Postprocedure Evaluation (Addendum)
Anesthesia Post Note  Patient: [name]  Procedure(s) Performed: Procedure(s) (LRB): ESOPHAGOGASTRODUODENOSCOPY (EGD) WITH PROPOFOL (N/A) COLONOSCOPY WITH PROPOFOL (N/A)  Patient location during evaluation: PACU Anesthesia Type: MAC Level of consciousness: awake and alert Pain management: pain level controlled Vital Signs Assessment: post-procedure vital signs reviewed and stable Respiratory status: spontaneous breathing, nonlabored ventilation, respiratory function stable and patient connected to nasal cannula oxygen Cardiovascular status: stable and blood pressure returned to baseline Anesthetic complications: no       Last Vitals:  Vitals:   03/26/17 1140 03/26/17 1150  BP: 127/75 133/73  Pulse: 82 79  Resp: 19 19  Temp:      Last Pain:  Vitals:   03/26/17 1125  TempSrc: Oral                 Effie Berkshire

## 2017-04-20 NOTE — Addendum Note (Signed)
Addendum  created 04/20/17 1225 by Compton Brigance D, MD   Sign clinical note    

## 2018-05-16 ENCOUNTER — Other Ambulatory Visit: Payer: Self-pay | Admitting: Nurse Practitioner

## 2018-05-16 ENCOUNTER — Ambulatory Visit
Admission: RE | Admit: 2018-05-16 | Discharge: 2018-05-16 | Disposition: A | Discharge status: Home / Self Care | Payer: No Typology Code available for payment source | Source: Ambulatory Visit | Attending: Nurse Practitioner | Admitting: Nurse Practitioner

## 2018-05-16 DIAGNOSIS — R200 Anesthesia of skin: Secondary | ICD-10-CM
